# Patient Record
Sex: Male | Born: 2005 | Race: White | Hispanic: Yes | Marital: Single | Smoking: Never smoker
Health system: Southern US, Community
[De-identification: ages and names within clinical notes are randomized; demographics above are authoritative.]

## PROBLEM LIST (undated history)

## (undated) HISTORY — PX: OTHER SURGICAL HISTORY: SHX169

---
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
## 2024-11-15 ENCOUNTER — Emergency Department (HOSPITAL_BASED_OUTPATIENT_CLINIC_OR_DEPARTMENT_OTHER)
Admission: EM | Admit: 2024-11-15 | Discharge: 2024-11-15 | Disposition: A | Discharge status: Home / Self Care | Attending: Emergency Medicine | Admitting: Emergency Medicine

## 2024-11-15 ENCOUNTER — Emergency Department (HOSPITAL_BASED_OUTPATIENT_CLINIC_OR_DEPARTMENT_OTHER)

## 2024-11-15 ENCOUNTER — Encounter (HOSPITAL_BASED_OUTPATIENT_CLINIC_OR_DEPARTMENT_OTHER): Payer: Self-pay

## 2024-11-15 ENCOUNTER — Other Ambulatory Visit: Payer: Self-pay

## 2024-11-15 DIAGNOSIS — R1013 Epigastric pain: Secondary | ICD-10-CM | POA: Diagnosis not present

## 2024-11-15 DIAGNOSIS — R1011 Right upper quadrant pain: Secondary | ICD-10-CM | POA: Diagnosis present

## 2024-11-15 DIAGNOSIS — R197 Diarrhea, unspecified: Secondary | ICD-10-CM | POA: Diagnosis not present

## 2024-11-15 DIAGNOSIS — R110 Nausea: Secondary | ICD-10-CM | POA: Diagnosis not present

## 2024-11-15 LAB — CBC WITH DIFFERENTIAL/PLATELET
Abs Immature Granulocytes: 0.02 10*3/uL (ref 0.00–0.07)
Basophils Absolute: 0 10*3/uL (ref 0.0–0.1)
Basophils Relative: 0 %
Eosinophils Absolute: 0 10*3/uL (ref 0.0–0.5)
Eosinophils Relative: 1 %
HCT: 42.5 % (ref 39.0–52.0)
Hemoglobin: 14.8 g/dL (ref 13.0–17.0)
Immature Granulocytes: 0 %
Lymphocytes Relative: 35 %
Lymphs Abs: 2.1 10*3/uL (ref 0.7–4.0)
MCH: 29.4 pg (ref 26.0–34.0)
MCHC: 34.8 g/dL (ref 30.0–36.0)
MCV: 84.3 fL (ref 80.0–100.0)
Monocytes Absolute: 0.5 10*3/uL (ref 0.1–1.0)
Monocytes Relative: 8 %
Neutro Abs: 3.3 10*3/uL (ref 1.7–7.7)
Neutrophils Relative %: 56 %
Platelets: 261 10*3/uL (ref 150–400)
RBC: 5.04 MIL/uL (ref 4.22–5.81)
RDW: 12.6 % (ref 11.5–15.5)
WBC: 5.9 10*3/uL (ref 4.0–10.5)
nRBC: 0 % (ref 0.0–0.2)

## 2024-11-15 LAB — COMPREHENSIVE METABOLIC PANEL WITH GFR
ALT: 18 U/L (ref 0–44)
AST: 19 U/L (ref 15–41)
Albumin: 5 g/dL (ref 3.5–5.0)
Alkaline Phosphatase: 91 U/L (ref 38–126)
Anion gap: 14 (ref 5–15)
BUN: 9 mg/dL (ref 6–20)
CO2: 24 mmol/L (ref 22–32)
Calcium: 10.2 mg/dL (ref 8.9–10.3)
Chloride: 99 mmol/L (ref 98–111)
Creatinine, Ser: 0.78 mg/dL (ref 0.61–1.24)
GFR, Estimated: >60 mL/min
Glucose, Bld: 99 mg/dL (ref 70–99)
Potassium: 3.6 mmol/L (ref 3.5–5.1)
Sodium: 138 mmol/L (ref 135–145)
Total Bilirubin: 0.7 mg/dL (ref 0.0–1.2)
Total Protein: 8 g/dL (ref 6.5–8.1)

## 2024-11-15 LAB — LIPASE, BLOOD: Lipase: 18 U/L (ref 11–51)

## 2024-11-15 LAB — URINALYSIS, ROUTINE W REFLEX MICROSCOPIC
Bilirubin Urine: NEGATIVE
Glucose, UA: NEGATIVE mg/dL
Hgb urine dipstick: NEGATIVE
Ketones, ur: NEGATIVE mg/dL
Leukocytes,Ua: NEGATIVE
Nitrite: NEGATIVE
Protein, ur: NEGATIVE mg/dL
Specific Gravity, Urine: 1.021 (ref 1.005–1.030)
pH: 6 (ref 5.0–8.0)

## 2024-11-15 MED ORDER — ONDANSETRON HCL 4 MG/2ML IJ SOLN
4.0000 mg | Freq: Once | INTRAMUSCULAR | Status: AC
  Administered 2024-11-15: 4 mg via INTRAVENOUS
  Filled 2024-11-15: qty 2

## 2024-11-15 MED ORDER — PANTOPRAZOLE SODIUM 20 MG PO TBEC: 40.0000 mg | DELAYED_RELEASE_TABLET | Freq: Every day | ORAL | 0 refills | Status: AC

## 2024-11-15 MED ORDER — PANTOPRAZOLE SODIUM 40 MG IV SOLR
40.0000 mg | Freq: Once | INTRAVENOUS | Status: AC
  Administered 2024-11-15: 40 mg via INTRAVENOUS
  Filled 2024-11-15: qty 10

## 2024-11-15 MED ORDER — IOHEXOL 300 MG/ML  SOLN
100.0000 mL | Freq: Once | INTRAMUSCULAR | Status: AC | PRN
  Administered 2024-11-15: 100 mL via INTRAVENOUS

## 2024-11-15 MED ORDER — ONDANSETRON 4 MG PO TBDP: 4.0000 mg | ORAL_TABLET | Freq: Three times a day (TID) | ORAL | 0 refills | Status: AC | PRN

## 2024-11-15 NOTE — ED Provider Notes (Signed)
 [place] EMERGENCY DEPARTMENT AT [facility] Provider Note   CSN: [id number] Arrival date & time: 11/15/24  9343     Patient presents with: Abdominal Pain and Nausea   Kevin Bates is a 19 y.o. male.   HPI     19yo male with no significant medical history presents with concern for epigastric abdominal pain.   Nausea and intermittent abdominal pain over the last week RUQ abdominal pain, then another day was left upper quadrant pain Last few days lots of pain in the center and today reflux Abdominal pain is lower now, but comes and goes, sometimes worse Small amount of diarrhea No fever Feeling urinary frequency and frequent stool, one was diarrhea  It is worse after eating No cough, congestion, runny nose Nausea, no vomiting  Took a diarrhea pill this AM 3AM had more severe pain and nausea  History reviewed. No pertinent past medical history.  Past Surgical History:  Procedure Laterality Date   eye lid surgery      Prior to Admission medications  Medication Sig Start Date End Date Taking? Authorizing Provider  ondansetron  (ZOFRAN -ODT) 4 MG disintegrating tablet Take 1 tablet (4 mg total) by mouth every 8 (eight) hours as needed for nausea or vomiting. 11/15/24  Yes Dreama Longs, MD  pantoprazole  (PROTONIX ) 20 MG tablet Take 2 tablets (40 mg total) by mouth daily. 11/15/24 11/29/24 Yes Dreama Longs, MD  Acetaminophen  (TYLENOL  CHILDRENS PO) Take by mouth. For fever and pain.    [provider]  ciprofloxacin  (CIPRO ) 250 MG tablet Take 1 tablet (250 mg total) by mouth every 12 (twelve) hours. 03/12/23   Schutt, Marsa HERO, PA-C  Ibuprofen  (MOTRIN  PO) Take 5 mLs by mouth every 4 (four) hours as needed. For fever    [provider]  Pediatric Multivit-Minerals-C (CHILDRENS VITAMINS PO) Take 1 tablet by mouth daily.    [provider]    Allergies: Amoxicillin    Review of Systems  Updated Vital Signs BP (!) 143/99 (BP  Location: Left Arm)   Pulse 68   Temp 98.5 F (36.9 C) (Oral)   Resp 20   Ht 6' (1.829 m)   Wt 80.7 kg   SpO2 99%   BMI 24.14 kg/m   Physical Exam Vitals and nursing note reviewed.  Constitutional:      General: He is not in acute distress.    Appearance: He is well-developed. He is not diaphoretic.  HENT:     Head: Normocephalic and atraumatic.  Eyes:     Conjunctiva/sclera: Conjunctivae normal.  Cardiovascular:     Rate and Rhythm: Normal rate and regular rhythm.  Pulmonary:     Effort: Pulmonary effort is normal. No respiratory distress.     Breath sounds: Normal breath sounds.  Abdominal:     General: There is no distension.     Palpations: Abdomen is soft.     Tenderness: There is abdominal tenderness in the right upper quadrant, epigastric area and left upper quadrant. There is no guarding.  Musculoskeletal:     Cervical back: Normal range of motion.  Skin:    General: Skin is warm and dry.  Neurological:     Mental Status: He is alert and oriented to person, place, and time.     (all labs ordered are listed, but only abnormal results are displayed) Labs Reviewed  CBC WITH DIFFERENTIAL/PLATELET  COMPREHENSIVE METABOLIC PANEL WITH GFR  LIPASE, BLOOD  URINALYSIS, ROUTINE W REFLEX MICROSCOPIC    EKG: None  Radiology: CT ABDOMEN PELVIS W CONTRAST Result Date: 11/15/2024 CLINICAL DATA:  Bilateral upper quadrant pain. EXAM: CT ABDOMEN AND PELVIS WITH CONTRAST TECHNIQUE: Multidetector CT imaging of the abdomen and pelvis was performed using the standard protocol following bolus administration of intravenous contrast. RADIATION DOSE REDUCTION: This exam was performed according to the departmental dose-optimization program which includes automated exposure control, adjustment of the mA and/or kV according to patient size and/or use of iterative reconstruction technique. CONTRAST:  OMNIPAQUE  IOHEXOL  300 MG/ML  SOLN COMPARISON:  None Available. FINDINGS: Lower  chest: No acute findings. Hepatobiliary: No suspicious focal abnormality within the liver parenchyma. There is no evidence for gallstones, gallbladder wall thickening, or pericholecystic fluid. No intrahepatic or extrahepatic biliary dilation. Pancreas: No focal mass lesion. No dilatation of the main duct. No intraparenchymal cyst. No peripancreatic edema. Spleen: No splenomegaly. No suspicious focal mass lesion. No infarct. Adrenals/Urinary Tract: No adrenal nodule or mass. Kidneys unremarkable. No evidence for hydroureter. The urinary bladder appears normal for the degree of distention. Stomach/Bowel: Stomach is unremarkable. No gastric wall thickening. No evidence of outlet obstruction. Duodenum is normally positioned as is the ligament of Treitz. No small bowel wall thickening. No small bowel dilatation. The terminal ileum is normal. The appendix is normal. No gross colonic mass. No colonic wall thickening. Vascular/Lymphatic: No abdominal aortic aneurysm. No abdominal aortic atherosclerotic calcification. There is no gastrohepatic or hepatoduodenal ligament lymphadenopathy. No retroperitoneal or mesenteric lymphadenopathy. No pelvic sidewall lymphadenopathy. Reproductive: The prostate gland and seminal vesicles are unremarkable. Other: No substantial intraperitoneal free fluid. No intraperitoneal free air. Musculoskeletal: No worrisome lytic or sclerotic osseous abnormality. IMPRESSION: No acute findings in the abdomen or pelvis. Specifically, no findings to explain the patient's history of bilateral upper quadrant pain. Electronically Signed   By: Camellia Candle M.D.   On: 11/15/2024 08:26     Procedures   Medications Ordered in the ED  pantoprazole  (PROTONIX ) injection 40 mg (40 mg Intravenous Given 11/15/24 0748)  ondansetron  (ZOFRAN ) injection 4 mg (4 mg Intravenous Given 11/15/24 0747)  iohexol  (OMNIPAQUE ) 300 MG/ML solution 100 mL (100 mLs Intravenous Contrast Given 11/15/24 0820)                                     Medical Decision Making Amount and/or Complexity of Data Reviewed Labs: ordered. Radiology: ordered.  Risk Prescription drug management.   19yo male with no significant medical history presents with concern for epigastric abdominal pain.   DDx includes appendicitis, pancreatitis, cholecystitis, pyelonephritis, nephrolithiasis, diverticulitis, PUD, splenic infarct, symptomatic cholelithiasis, gastritis.  Labs completed and personally via interpreted by me show no anemia, no leukocytosis, no clinically significant electrolyte abnormalities.  There is no transaminitis or signs of pancreatitis.  Urinalysis shows no evidence of UTI  CT abdomen pelvis was performed to evaluate for possible etiologies of his epigastric pain including cholecystitis, splenic infarct, perforation gastritis---   CT completed and evaluated by me and radiology shows no acute findings, no sign of cholecystitis or other acute abnormalities.    Suspect likely gastritis/PUD. Discussed possibility of cholelithiasis not seen on CT but doubt cholcesystitis.  Recommend PPI, zofran , PCP follow up. Patient discharged in stable condition with understanding of reasons to return.     Final diagnoses:  Epigastric abdominal pain    ED Discharge Orders          Ordered    pantoprazole  (PROTONIX ) 20 MG tablet  Daily  11/15/24 0852    ondansetron  (ZOFRAN -ODT) 4 MG disintegrating tablet  Every 8 hours PRN        11/15/24 9147               Dreama Longs, MD 11/15/24 1740  "

## 2024-11-15 NOTE — ED Triage Notes (Addendum)
 Arrives POV with complaints of intermittent abdominal pain and nausea x1 week. Patient states that the pain woke him out of his sleep today.
